# Patient Record
Sex: Female | Born: 1985 | Race: Black or African American | Hispanic: No | Marital: Single | Smoking: Current every day smoker
Health system: Southern US, Community
[De-identification: ages and names within clinical notes are randomized; demographics above are authoritative.]

## PROBLEM LIST (undated history)

## (undated) DIAGNOSIS — J45909 Unspecified asthma, uncomplicated: Secondary | ICD-10-CM

---

## 2014-08-09 ENCOUNTER — Encounter (HOSPITAL_BASED_OUTPATIENT_CLINIC_OR_DEPARTMENT_OTHER): Payer: Self-pay | Admitting: Emergency Medicine

## 2014-08-09 ENCOUNTER — Emergency Department (HOSPITAL_BASED_OUTPATIENT_CLINIC_OR_DEPARTMENT_OTHER): Payer: Medicaid Other

## 2014-08-09 ENCOUNTER — Emergency Department (HOSPITAL_BASED_OUTPATIENT_CLINIC_OR_DEPARTMENT_OTHER)
Admission: EM | Admit: 2014-08-09 | Discharge: 2014-08-09 | Disposition: A | Discharge status: Home / Self Care | Payer: Medicaid Other | Attending: Emergency Medicine | Admitting: Emergency Medicine

## 2014-08-09 DIAGNOSIS — F1010 Alcohol abuse, uncomplicated: Secondary | ICD-10-CM | POA: Diagnosis not present

## 2014-08-09 DIAGNOSIS — J45901 Unspecified asthma with (acute) exacerbation: Secondary | ICD-10-CM | POA: Diagnosis not present

## 2014-08-09 DIAGNOSIS — R0602 Shortness of breath: Secondary | ICD-10-CM | POA: Insufficient documentation

## 2014-08-09 DIAGNOSIS — F10920 Alcohol use, unspecified with intoxication, uncomplicated: Secondary | ICD-10-CM

## 2014-08-09 DIAGNOSIS — R0600 Dyspnea, unspecified: Secondary | ICD-10-CM

## 2014-08-09 HISTORY — DX: Unspecified asthma, uncomplicated: J45.909

## 2014-08-09 LAB — CBC WITH DIFFERENTIAL/PLATELET
BASOS ABS: 0 10*3/uL (ref 0.0–0.1)
Basophils Relative: 0 % (ref 0–1)
EOS PCT: 1 % (ref 0–5)
Eosinophils Absolute: 0.1 10*3/uL (ref 0.0–0.7)
HCT: 41.9 % (ref 36.0–46.0)
HEMOGLOBIN: 14.7 g/dL (ref 12.0–15.0)
LYMPHS ABS: 2.4 10*3/uL (ref 0.7–4.0)
LYMPHS PCT: 35 % (ref 12–46)
MCH: 30.8 pg (ref 26.0–34.0)
MCHC: 35.1 g/dL (ref 30.0–36.0)
MCV: 87.8 fL (ref 78.0–100.0)
MONO ABS: 0.5 10*3/uL (ref 0.1–1.0)
MONOS PCT: 7 % (ref 3–12)
Neutro Abs: 3.9 10*3/uL (ref 1.7–7.7)
Neutrophils Relative %: 57 % (ref 43–77)
Platelets: 187 10*3/uL (ref 150–400)
RBC: 4.77 MIL/uL (ref 3.87–5.11)
RDW: 12.6 % (ref 11.5–15.5)
WBC: 6.8 10*3/uL (ref 4.0–10.5)

## 2014-08-09 LAB — COMPREHENSIVE METABOLIC PANEL
ALT: <5 U/L (ref 0–35)
ANION GAP: 13 (ref 5–15)
AST: 17 U/L (ref 0–37)
Albumin: 4.3 g/dL (ref 3.5–5.2)
Alkaline Phosphatase: 81 U/L (ref 39–117)
BUN: 12 mg/dL (ref 6–23)
CHLORIDE: 106 meq/L (ref 96–112)
CO2: 25 meq/L (ref 19–32)
CREATININE: 0.9 mg/dL (ref 0.50–1.10)
Calcium: 9.8 mg/dL (ref 8.4–10.5)
GFR calc Af Amer: >90 mL/min (ref >90)
GFR, EST NON AFRICAN AMERICAN: 88 mL/min — AB (ref >90)
GLUCOSE: 107 mg/dL — AB (ref 70–99)
Potassium: 4 mEq/L (ref 3.7–5.3)
Sodium: 144 mEq/L (ref 137–147)
Total Protein: 8.6 g/dL — ABNORMAL HIGH (ref 6.0–8.3)

## 2014-08-09 LAB — RAPID URINE DRUG SCREEN, HOSP PERFORMED
AMPHETAMINES: NOT DETECTED
Barbiturates: NOT DETECTED
Benzodiazepines: NOT DETECTED
Cocaine: NOT DETECTED
OPIATES: NOT DETECTED
Tetrahydrocannabinol: NOT DETECTED

## 2014-08-09 LAB — ETHANOL: Alcohol, Ethyl (B): 138 mg/dL — ABNORMAL HIGH (ref 0–11)

## 2014-08-09 MED ORDER — SODIUM CHLORIDE 0.9 % IV BOLUS (SEPSIS)
1000.0000 mL | Freq: Once | INTRAVENOUS | Status: AC
  Administered 2014-08-09: 1000 mL via INTRAVENOUS

## 2014-08-09 NOTE — ED Notes (Signed)
MD at bedside. 
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
## 2014-08-09 NOTE — Discharge Instructions (Signed)
Shortness of Breath °Shortness of breath means you have trouble breathing. Shortness of breath needs medical care right away. °HOME CARE  °· Do not smoke. °· Avoid being around chemicals or things (paint fumes, dust) that may bother your breathing. °· Rest as needed. Slowly begin your normal activities. °· Only take medicines as told by your doctor. °· Keep all doctor visits as told. °GET HELP RIGHT AWAY IF:  °· Your shortness of breath gets worse. °· You feel lightheaded, pass out (faint), or have a cough that is not helped by medicine. °· You cough up blood. °· You have pain with breathing. °· You have pain in your chest, arms, shoulders, or belly (abdomen). °· You have a fever. °· You cannot walk up stairs or exercise the way you normally do. °· You do not get better in the time expected. °· You have a hard time doing normal activities even with rest. °· You have problems with your medicines. °· You have any new symptoms. °MAKE SURE YOU: °· Understand these instructions. °· Will watch your condition. °· Will get help right away if you are not doing well or get worse. °Document Released: 05/25/2008 Document Revised: 12/12/2013 Document Reviewed: 02/22/2012 °ExitCare® Patient Information ©2015 [employer]. This information is not intended to replace advice given to you by your health care provider. Make sure you discuss any questions you have with your health care provider. °Generalized Anxiety Disorder °Generalized anxiety disorder (GAD) is a mental disorder. It interferes with life functions, including relationships, work, and school. °GAD is different from normal anxiety, which everyone experiences at some point in their lives in response to specific life events and activities. Normal anxiety actually helps us prepare for and get through these life events and activities. Normal anxiety goes away after the event or activity is over.  °GAD causes anxiety that is not necessarily related to specific events or  activities. It also causes excess anxiety in proportion to specific events or activities. The anxiety associated with GAD is also difficult to control. GAD can vary from mild to severe. People with severe GAD can have intense waves of anxiety with physical symptoms (panic attacks).  °SYMPTOMS °The anxiety and worry associated with GAD are difficult to control. This anxiety and worry are related to many life events and activities and also occur more days than not for 6 months or longer. People with GAD also have three or more of the following symptoms (one or more in children): °· Restlessness.   °· Fatigue. °· Difficulty concentrating.   °· Irritability. °· Muscle tension. °· Difficulty sleeping or unsatisfying sleep. °DIAGNOSIS °GAD is diagnosed through an assessment by your health care provider. Your health care provider will ask you questions about your mood, physical symptoms, and events in your life. Your health care provider may ask you about your medical history and use of alcohol or drugs, including prescription medicines. Your health care provider may also do a physical exam and blood tests. Certain medical conditions and the use of certain substances can cause symptoms similar to those associated with GAD. Your health care provider may refer you to a mental health specialist for further evaluation. °TREATMENT °The following therapies are usually used to treat GAD:  °· Medication. Antidepressant medication usually is prescribed for long-term daily control. Antianxiety medicines may be added in severe cases, especially when panic attacks occur.   °· Talk therapy (psychotherapy). Certain types of talk therapy can be helpful in treating GAD by providing support, education, and guidance. A form of   talk therapy called cognitive behavioral therapy can teach you healthy ways to think about and react to daily life events and activities. °· Stress management techniques. These include yoga, meditation, and exercise  and can be very helpful when they are practiced regularly. °A mental health specialist can help determine which treatment is best for you. Some people see improvement with one therapy. However, other people require a combination of therapies. °Document Released: 04/03/2013 Document Revised: 04/23/2014 Document Reviewed: 04/03/2013 °ExitCare® Patient Information ©2015 [employer]. This information is not intended to replace advice given to you by your health care provider. Make sure you discuss any questions you have with your health care provider. ° °

## 2014-08-09 NOTE — ED Notes (Signed)
On arrival pt hyperventilating, c/o SOB; pt appears anxious, no distress

## 2015-06-29 ENCOUNTER — Encounter (HOSPITAL_BASED_OUTPATIENT_CLINIC_OR_DEPARTMENT_OTHER): Payer: Self-pay | Admitting: Emergency Medicine

## 2015-06-29 ENCOUNTER — Emergency Department (HOSPITAL_BASED_OUTPATIENT_CLINIC_OR_DEPARTMENT_OTHER)
Admission: EM | Admit: 2015-06-29 | Discharge: 2015-06-29 | Disposition: A | Discharge status: Home / Self Care | Payer: Medicaid Other | Attending: Emergency Medicine | Admitting: Emergency Medicine

## 2015-06-29 DIAGNOSIS — Z3202 Encounter for pregnancy test, result negative: Secondary | ICD-10-CM | POA: Insufficient documentation

## 2015-06-29 DIAGNOSIS — N926 Irregular menstruation, unspecified: Secondary | ICD-10-CM

## 2015-06-29 DIAGNOSIS — R109 Unspecified abdominal pain: Secondary | ICD-10-CM | POA: Diagnosis present

## 2015-06-29 DIAGNOSIS — J45909 Unspecified asthma, uncomplicated: Secondary | ICD-10-CM | POA: Insufficient documentation

## 2015-06-29 LAB — CBC WITH DIFFERENTIAL/PLATELET
Basophils Absolute: 0 10*3/uL (ref 0.0–0.1)
Basophils Relative: 0 % (ref 0–1)
Eosinophils Absolute: 0.1 10*3/uL (ref 0.0–0.7)
Eosinophils Relative: 1 % (ref 0–5)
HCT: 41.3 % (ref 36.0–46.0)
Hemoglobin: 14.3 g/dL (ref 12.0–15.0)
Lymphocytes Relative: 19 % (ref 12–46)
Lymphs Abs: 1.3 10*3/uL (ref 0.7–4.0)
MCH: 31.4 pg (ref 26.0–34.0)
MCHC: 34.6 g/dL (ref 30.0–36.0)
MCV: 90.8 fL (ref 78.0–100.0)
Monocytes Absolute: 0.4 10*3/uL (ref 0.1–1.0)
Monocytes Relative: 6 % (ref 3–12)
NEUTROS PCT: 74 % (ref 43–77)
Neutro Abs: 5.2 10*3/uL (ref 1.7–7.7)
PLATELETS: 177 10*3/uL (ref 150–400)
RBC: 4.55 MIL/uL (ref 3.87–5.11)
RDW: 13 % (ref 11.5–15.5)
WBC: 7 10*3/uL (ref 4.0–10.5)

## 2015-06-29 LAB — COMPREHENSIVE METABOLIC PANEL
ALBUMIN: 4.5 g/dL (ref 3.5–5.0)
ALK PHOS: 62 U/L (ref 38–126)
ALT: 14 U/L (ref 14–54)
ANION GAP: 5 (ref 5–15)
AST: 24 U/L (ref 15–41)
BILIRUBIN TOTAL: 0.5 mg/dL (ref 0.3–1.2)
BUN: 14 mg/dL (ref 6–20)
CHLORIDE: 107 mmol/L (ref 101–111)
CO2: 26 mmol/L (ref 22–32)
Calcium: 9 mg/dL (ref 8.9–10.3)
Creatinine, Ser: 0.93 mg/dL (ref 0.44–1.00)
GFR calc non Af Amer: >60 mL/min (ref >60)
Glucose, Bld: 87 mg/dL (ref 65–99)
POTASSIUM: 4 mmol/L (ref 3.5–5.1)
SODIUM: 138 mmol/L (ref 135–145)
TOTAL PROTEIN: 7.6 g/dL (ref 6.5–8.1)

## 2015-06-29 LAB — URINALYSIS, ROUTINE W REFLEX MICROSCOPIC
Bilirubin Urine: NEGATIVE
GLUCOSE, UA: NEGATIVE mg/dL
HGB URINE DIPSTICK: NEGATIVE
KETONES UR: NEGATIVE mg/dL
Nitrite: NEGATIVE
PH: 8 (ref 5.0–8.0)
PROTEIN: NEGATIVE mg/dL
Specific Gravity, Urine: 1.022 (ref 1.005–1.030)
Urobilinogen, UA: 1 mg/dL (ref 0.0–1.0)

## 2015-06-29 LAB — URINE MICROSCOPIC-ADD ON

## 2015-06-29 LAB — PREGNANCY, URINE: Preg Test, Ur: NEGATIVE

## 2015-06-29 LAB — LIPASE, BLOOD: Lipase: 16 U/L — ABNORMAL LOW (ref 22–51)

## 2015-06-29 MED ORDER — SODIUM CHLORIDE 0.9 % IV BOLUS (SEPSIS)
1000.0000 mL | Freq: Once | INTRAVENOUS | Status: AC
  Administered 2015-06-29: 1000 mL via INTRAVENOUS

## 2015-06-29 MED ORDER — ONDANSETRON HCL 4 MG/2ML IJ SOLN
4.0000 mg | Freq: Once | INTRAMUSCULAR | Status: AC
  Administered 2015-06-29: 4 mg via INTRAVENOUS
  Filled 2015-06-29: qty 2

## 2015-06-29 NOTE — ED Notes (Signed)
Pt in c/o pelvic pain and dysfunctional menstrual cycles x 1 month. Today began and "twisting" feeling in her upper abdomen associated with nausea and dizziness.

## 2015-06-29 NOTE — ED Provider Notes (Signed)
CSN: [id number]     Arrival date & time 06/29/15  1137 History   First MD Initiated Contact with Patient 06/29/15 1209     Chief Complaint  Patient presents with  . Abdominal Pain     (Consider location/radiation/quality/duration/timing/severity/associated sxs/prior Treatment) HPI   Blood pressure 120/85, pulse 64, temperature 97.5 F (36.4 C), temperature source Oral, resp. rate 18, height  (1.575 m), weight 150 lb (68.04 kg), SpO2 100 %.  Sharon Murphy is a 29 y.o. female complaining of irregular menses with painful menstrual cramps worsening over the course of 2 months last period was July 2 and lasted for 3 days. Patient also reports a single episode of nonbloody, bilious, non-coffee ground emesis with diarrhea onset this a.m.  She has drank a bottle of water since the episode of emesis.  Patient denies fever, chills, sick contacts, rash, camping, hematochezia, melena, recent antibotics use. She rates her pain at 7 out of 10, described as cramping, no exacerbating or alleviating factors identified, and she says it is diffuse.She states that on her last period she was passing more blood clots than normal. [facility] 4 days ago, he performed a pelvic exam and her a prescription for birth control medications which she did not fill. Patient states she's never taken birth control before so she doesn't want to start now.   Past Medical History  Diagnosis Date  . Asthma    Past Surgical History  Procedure Laterality Date  . Cesarean section     History reviewed. No pertinent family history. History  Substance Use Topics  . Smoking status: Never Smoker   . Smokeless tobacco: Not on file  . Alcohol Use: Yes   OB History    No data available     Review of Systems  10 systems reviewed and found to be negative, except as noted in the HPI.   Allergies  Review of patient's allergies indicates no known allergies.  Home Medications    Prior to Admission medications   Not on File   BP 102/87 mmHg  Pulse 62  Temp(Src) 97.5 F (36.4 C) (Oral)  Resp 16  Ht  (1.575 m)  Wt 150 lb (68.04 kg)  BMI 27.43 kg/m2  SpO2 100% Physical Exam  Constitutional: She is oriented to person, place, and time. She appears well-developed and well-nourished. No distress.  Laughing, watching a video on her phone with her guest  HENT:  Head: Normocephalic and atraumatic.  Mouth/Throat: Oropharynx is clear and moist.  No Conjunctival pallor  Eyes: Conjunctivae and EOM are normal. Pupils are equal, round, and reactive to light.  Neck: Normal range of motion.  Cardiovascular: Normal rate, regular rhythm and intact distal pulses.   Pulmonary/Chest: Effort normal and breath sounds normal. No stridor.  Abdominal: Soft. Bowel sounds are normal. She exhibits no distension and no mass. There is no tenderness. There is no rebound and no guarding.  Mild, diffuse tenderness to palpation with no guarding or rebound.  Murphy sign negative, no tenderness to palpation over McBurney's point, Rovsings, Psoas and obturator all negative.   Musculoskeletal: Normal range of motion.  Neurological: She is alert and oriented to person, place, and time.  Skin: She is not diaphoretic.  Psychiatric: She has a normal mood and affect.  Nursing note and vitals reviewed.   ED Course  Procedures (including critical care time) Labs Review Labs Reviewed  URINALYSIS, ROUTINE W REFLEX MICROSCOPIC (NOT AT [facility]) -  Abnormal; Notable for the following:    APPearance CLOUDY (*)    Leukocytes, UA SMALL (*)    All other components within normal limits  LIPASE, BLOOD - Abnormal; Notable for the following:    Lipase 16 (*)    All other components within normal limits  URINE MICROSCOPIC-ADD ON - Abnormal; Notable for the following:    Squamous Epithelial / LPF FEW (*)    Bacteria, UA FEW (*)    All other components within normal limits  PREGNANCY, URINE  CBC  WITH DIFFERENTIAL/PLATELET  COMPREHENSIVE METABOLIC PANEL    Imaging Review No results found.   EKG Interpretation None      MDM   Final diagnoses:  Abnormal menses    Filed Vitals:   06/29/15 1143 06/29/15 1316  BP: 120/85 102/87  Pulse: 64 62  Temp: 97.5 F (36.4 C)   TempSrc: Oral   Resp: 18 16  Height: 5\' 2"  (1.575 m)   Weight: 150 lb (68.04 kg)   SpO2: 100% 100%    Medications  sodium chloride 0.9 % bolus 1,000 mL (0 mLs Intravenous Stopped 06/29/15 1314)  ondansetron (ZOFRAN) injection 4 mg (4 mg Intravenous Given 06/29/15 1240)    Sharon Murphy is a pleasant 29 y.o. female presenting with irregular and painful menses worsening over the course of 2 months. Patient saw her OB/GYN and had a pelvic exam within the last week. OB/GYN recommended oral birth control but patient prefers not to take this. Abdominal exam is nonsurgical, no abnormalities in vital signs. Will check orthostatics, basic blood work and urinalysis. Declines pain meds,   Pt is tolerating PO, repeat abd exam benign, work and urinalysis without acute abnormality. Encouraged patient to follow with her OB/GYN and start taking the oral birth control, we discussed how this will normalize her hormones and provide more regular and less painful menses.  Evaluation does not show pathology that would require ongoing emergent intervention or inpatient treatment. Pt is hemodynamically stable and mentating appropriately. Discussed findings and plan with patient/guardian, who agrees with care plan. All questions answered. Return precautions discussed and outpatient follow up given.     Wynetta Emeryicole Jasmin Winberry, PA-C 06/29/15 1333  Vanetta MuldersScott Zackowski, MD 06/30/15 [phone]

## 2015-06-29 NOTE — Discharge Instructions (Signed)
Please follow with your primary care doctor in the next 2 days for a check-up. They must obtain records for further management.  ° °Do not hesitate to return to the Emergency Department for any new, worsening or concerning symptoms.  ° ° °Abnormal Uterine Bleeding °Abnormal uterine bleeding means bleeding from the vagina that is not your normal menstrual period. This can be: °· Bleeding or spotting between periods. °· Bleeding after sex (sexual intercourse). °· Bleeding that is heavier or more than normal. °· Periods that last longer than usual. °· Bleeding after menopause. °There are many problems that may cause this. Treatment will depend on the cause of the bleeding. Any kind of bleeding that is not normal should be reviewed by your doctor.  °HOME CARE °Watch your condition for any changes. These actions may lessen any discomfort you are having: °· Do not use tampons or douches as told by your doctor. °· Change your pads often. °You should get regular pelvic exams and Pap tests. Keep all appointments for tests as told by your doctor. °GET HELP IF: °· You are bleeding for more than 1 week. °· You feel dizzy at times. °GET HELP RIGHT AWAY IF:  °· You pass out. °· You have to change pads every 15 to 30 minutes. °· You have belly pain. °· You have a fever. °· You become sweaty or weak. °· You are passing large blood clots from the vagina. °· You feel sick to your stomach (nauseous) and throw up (vomit). °MAKE SURE YOU: °· Understand these instructions. °· Will watch your condition. °· Will get help right away if you are not doing well or get worse. °Document Released: 10/04/2009 Document Revised: 12/12/2013 Document Reviewed: 07/06/2013 °Exit[employer]® Patient Information ©2015 [employer]. This information is not intended to replace advice given to you by your health care provider. Make sure you discuss any questions you have with your health care provider. ° °

## 2015-06-29 NOTE — ED Notes (Signed)
States having irregular periods, approx 2/ month, also having abd cramping, at times is more heavier with often having blood clots being noted during the period.

## 2015-06-29 NOTE — ED Notes (Signed)
States also having poor appetite at times as well.

## 2017-09-02 ENCOUNTER — Emergency Department: Payer: No Typology Code available for payment source

## 2017-09-02 ENCOUNTER — Encounter: Payer: Self-pay | Admitting: Emergency Medicine

## 2017-09-02 ENCOUNTER — Emergency Department
Admission: EM | Admit: 2017-09-02 | Discharge: 2017-09-02 | Disposition: A | Discharge status: Home / Self Care | Payer: No Typology Code available for payment source | Attending: Student in an Organized Health Care Education/Training Program | Admitting: Student in an Organized Health Care Education/Training Program

## 2017-09-02 DIAGNOSIS — M25512 Pain in left shoulder: Secondary | ICD-10-CM | POA: Insufficient documentation

## 2017-09-02 DIAGNOSIS — Y998 Other external cause status: Secondary | ICD-10-CM | POA: Insufficient documentation

## 2017-09-02 DIAGNOSIS — Y9389 Activity, other specified: Secondary | ICD-10-CM | POA: Insufficient documentation

## 2017-09-02 DIAGNOSIS — M79642 Pain in left hand: Secondary | ICD-10-CM | POA: Diagnosis not present

## 2017-09-02 DIAGNOSIS — Y92411 Interstate highway as the place of occurrence of the external cause: Secondary | ICD-10-CM | POA: Insufficient documentation

## 2017-09-02 DIAGNOSIS — S61412A Laceration without foreign body of left hand, initial encounter: Secondary | ICD-10-CM | POA: Diagnosis not present

## 2017-09-02 MED ORDER — HYDROCODONE-ACETAMINOPHEN 5-325 MG PO TABS: 1.0000 | ORAL_TABLET | Freq: Four times a day (QID) | ORAL | 0 refills | Status: AC | PRN
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
## 2017-09-02 NOTE — ED Notes (Signed)
Pt wound clean and dry, non stick adhesive applied and wrapped with gauze at this time

## 2018-06-21 ENCOUNTER — Other Ambulatory Visit: Payer: Self-pay

## 2018-06-21 ENCOUNTER — Emergency Department (HOSPITAL_BASED_OUTPATIENT_CLINIC_OR_DEPARTMENT_OTHER): Payer: Self-pay

## 2018-06-21 ENCOUNTER — Emergency Department (HOSPITAL_BASED_OUTPATIENT_CLINIC_OR_DEPARTMENT_OTHER)
Admission: EM | Admit: 2018-06-21 | Discharge: 2018-06-22 | Disposition: A | Discharge status: Home / Self Care | Payer: Self-pay | Attending: Emergency Medicine | Admitting: Emergency Medicine

## 2018-06-21 ENCOUNTER — Encounter (HOSPITAL_BASED_OUTPATIENT_CLINIC_OR_DEPARTMENT_OTHER): Payer: Self-pay

## 2018-06-21 DIAGNOSIS — J45909 Unspecified asthma, uncomplicated: Secondary | ICD-10-CM | POA: Insufficient documentation

## 2018-06-21 DIAGNOSIS — F1210 Cannabis abuse, uncomplicated: Secondary | ICD-10-CM | POA: Insufficient documentation

## 2018-06-21 DIAGNOSIS — R112 Nausea with vomiting, unspecified: Secondary | ICD-10-CM | POA: Insufficient documentation

## 2018-06-21 DIAGNOSIS — F17200 Nicotine dependence, unspecified, uncomplicated: Secondary | ICD-10-CM | POA: Insufficient documentation

## 2018-06-21 DIAGNOSIS — N390 Urinary tract infection, site not specified: Secondary | ICD-10-CM | POA: Insufficient documentation

## 2018-06-21 DIAGNOSIS — R350 Frequency of micturition: Secondary | ICD-10-CM | POA: Insufficient documentation

## 2018-06-21 DIAGNOSIS — R197 Diarrhea, unspecified: Secondary | ICD-10-CM | POA: Insufficient documentation

## 2018-06-21 LAB — CBC WITH DIFFERENTIAL/PLATELET
Basophils Absolute: 0 10*3/uL (ref 0.0–0.1)
Basophils Relative: 0 %
EOS PCT: 1 %
Eosinophils Absolute: 0.1 10*3/uL (ref 0.0–0.7)
HEMATOCRIT: 43.4 % (ref 36.0–46.0)
Hemoglobin: 15.6 g/dL — ABNORMAL HIGH (ref 12.0–15.0)
LYMPHS ABS: 1.1 10*3/uL (ref 0.7–4.0)
Lymphocytes Relative: 9 %
MCH: 31.5 pg (ref 26.0–34.0)
MCHC: 35.9 g/dL (ref 30.0–36.0)
MCV: 87.7 fL (ref 78.0–100.0)
Monocytes Absolute: 0.8 10*3/uL (ref 0.1–1.0)
Monocytes Relative: 7 %
Neutro Abs: 9.7 10*3/uL — ABNORMAL HIGH (ref 1.7–7.7)
Neutrophils Relative %: 83 %
PLATELETS: 190 10*3/uL (ref 150–400)
RBC: 4.95 MIL/uL (ref 3.87–5.11)
RDW: 12.7 % (ref 11.5–15.5)
WBC: 11.6 10*3/uL — AB (ref 4.0–10.5)

## 2018-06-21 LAB — URINALYSIS, ROUTINE W REFLEX MICROSCOPIC
Glucose, UA: NEGATIVE mg/dL
Ketones, ur: 40 mg/dL — AB
NITRITE: NEGATIVE
Protein, ur: NEGATIVE mg/dL
SPECIFIC GRAVITY, URINE: 1.025 (ref 1.005–1.030)
pH: 6 (ref 5.0–8.0)

## 2018-06-21 LAB — URINALYSIS, MICROSCOPIC (REFLEX)

## 2018-06-21 LAB — COMPREHENSIVE METABOLIC PANEL
ALT: 17 U/L (ref 0–44)
ANION GAP: 10 (ref 5–15)
AST: 33 U/L (ref 15–41)
Albumin: 4.3 g/dL (ref 3.5–5.0)
Alkaline Phosphatase: 90 U/L (ref 38–126)
BUN: 15 mg/dL (ref 6–20)
CO2: 24 mmol/L (ref 22–32)
Calcium: 9.1 mg/dL (ref 8.9–10.3)
Chloride: 103 mmol/L (ref 98–111)
Creatinine, Ser: 0.91 mg/dL (ref 0.44–1.00)
GFR calc Af Amer: >60 mL/min (ref >60)
GFR calc non Af Amer: >60 mL/min (ref >60)
Glucose, Bld: 88 mg/dL (ref 70–99)
POTASSIUM: 3.6 mmol/L (ref 3.5–5.1)
Sodium: 137 mmol/L (ref 135–145)
TOTAL PROTEIN: 8 g/dL (ref 6.5–8.1)
Total Bilirubin: 0.5 mg/dL (ref 0.3–1.2)

## 2018-06-21 LAB — PREGNANCY, URINE: Preg Test, Ur: NEGATIVE

## 2018-06-21 LAB — LIPASE, BLOOD: Lipase: 28 U/L (ref 11–51)

## 2018-06-21 MED ORDER — NITROFURANTOIN MONOHYD MACRO 100 MG PO CAPS
100.0000 mg | ORAL_CAPSULE | Freq: Once | ORAL | Status: AC
  Administered 2018-06-21: 100 mg via ORAL
  Filled 2018-06-21: qty 1

## 2018-06-21 MED ORDER — NITROFURANTOIN MONOHYD MACRO 100 MG PO CAPS: 100.0000 mg | ORAL_CAPSULE | Freq: Two times a day (BID) | ORAL | 0 refills | Status: AC

## 2018-06-21 MED ORDER — KETOROLAC TROMETHAMINE 30 MG/ML IJ SOLN
15.0000 mg | Freq: Once | INTRAMUSCULAR | Status: AC
  Administered 2018-06-21: 15 mg via INTRAVENOUS
  Filled 2018-06-21: qty 1

## 2018-06-21 MED ORDER — ONDANSETRON HCL 4 MG/2ML IJ SOLN
4.0000 mg | Freq: Once | INTRAMUSCULAR | Status: AC
  Administered 2018-06-21: 4 mg via INTRAVENOUS
  Filled 2018-06-21: qty 2

## 2018-06-21 NOTE — ED Notes (Signed)
ED Provider at bedside. 

## 2018-06-21 NOTE — ED Triage Notes (Signed)
C/o abd pain, diarrhea, sore throat x 3 days-NAD-steady gait to scale-to triage in w/c

## 2018-06-22 ENCOUNTER — Encounter (HOSPITAL_BASED_OUTPATIENT_CLINIC_OR_DEPARTMENT_OTHER): Payer: Self-pay | Admitting: Emergency Medicine

## 2018-06-22 NOTE — ED Provider Notes (Signed)
[facility] EMERGENCY DEPARTMENT Provider Note   CSN: [id number] Arrival date & time: 06/21/18  1951     History   Chief Complaint Chief Complaint  Patient presents with  . Abdominal Pain    HPI Sharon Murphy is a 32 y.o. female.  The history is provided by the patient.  Abdominal Pain   This is a new problem. The current episode started 2 days ago. The problem occurs constantly. The problem has not changed since onset.The pain is associated with an unknown factor. The pain is located in the suprapubic region. The quality of the pain is cramping. The pain is at a severity of 5/10. The pain is moderate. Associated symptoms include diarrhea, nausea, vomiting and frequency. Pertinent negatives include fever and headaches. Nothing aggravates the symptoms. Nothing relieves the symptoms. Past workup does not include barium enema. Her past medical history does not include irritable bowel syndrome.    Past Medical History:  Diagnosis Date  . Asthma     There are no active problems to display for this patient.   Past Surgical History:  Procedure Laterality Date  . CESAREAN SECTION       OB History   None      Home Medications    Prior to Admission medications   Medication Sig Start Date End Date Taking? Authorizing Provider  HYDROcodone-acetaminophen (NORCO/VICODIN) 5-325 MG tablet Take 1 tablet by mouth every 6 (six) hours as needed for moderate pain. 09/02/17   Tommi Rumps, PA-C  nitrofurantoin, macrocrystal-monohydrate, (MACROBID) 100 MG capsule Take 1 capsule (100 mg total) by mouth 2 (two) times daily. X 7 days 06/21/18   Nicanor Alcon, Aaidyn San, MD    Family History No family history on file.  Social History Social History   Tobacco Use  . Smoking status: Current Every Day Smoker  . Smokeless tobacco: Never Used  Substance Use Topics  . Alcohol use: Yes    Comment: weekly  . Drug use: Yes    Types: Marijuana     Allergies   Patient has no known  allergies.   Review of Systems Review of Systems  Constitutional: Negative for fever and unexpected weight change.  Eyes: Negative for photophobia.  Respiratory: Negative for shortness of breath.   Cardiovascular: Negative for chest pain.  Gastrointestinal: Positive for abdominal pain, diarrhea, nausea and vomiting.  Genitourinary: Positive for frequency. Negative for flank pain.  Neurological: Negative for headaches.  All other systems reviewed and are negative.    Physical Exam Updated Vital Signs BP 126/84 (BP Location: Left Arm)   Pulse 92   Temp 98.3 F (36.8 C) (Oral)   Resp 18   Ht 5\' 2"  (1.575 m)   Wt 75.4 kg (166 lb 3 oz)   LMP 06/14/2018   SpO2 98%   BMI 30.40 kg/m   Physical Exam  Constitutional: She is oriented to person, place, and time. She appears well-developed and well-nourished. No distress.  HENT:  Head: Normocephalic and atraumatic.  Mouth/Throat: No oropharyngeal exudate.  Tonsillar stones   Eyes: Pupils are equal, round, and reactive to light. Conjunctivae are normal.  Neck: Normal range of motion. Neck supple.  Cardiovascular: Normal rate, regular rhythm, normal heart sounds and intact distal pulses.  Pulmonary/Chest: Effort normal and breath sounds normal. No stridor. She has no wheezes. She has no rales.  Abdominal: Soft. Bowel sounds are normal. She exhibits no distension and no mass. There is no tenderness. There is no rebound and no guarding. No hernia.  Musculoskeletal: Normal range of motion.  Neurological: She is alert and oriented to person, place, and time. She displays normal reflexes.  Skin: Skin is warm and dry. Capillary refill takes less than 2 seconds.  Psychiatric: She has a normal mood and affect.     ED Treatments / Results  Labs (all labs ordered are listed, but only abnormal results are displayed) Results for orders placed or performed during the hospital encounter of 06/21/18  CBC with Differential  Result Value Ref  Range   WBC 11.6 (H) 4.0 - 10.5 K/uL   RBC 4.95 3.87 - 5.11 MIL/uL   Hemoglobin 15.6 (H) 12.0 - 15.0 g/dL   HCT 16.1 09.6 - 04.5 %   MCV 87.7 78.0 - 100.0 fL   MCH 31.5 26.0 - 34.0 pg   MCHC 35.9 30.0 - 36.0 g/dL   RDW 40.9 81.1 - 91.4 %   Platelets 190 150 - 400 K/uL   Neutrophils Relative % 83 %   Neutro Abs 9.7 (H) 1.7 - 7.7 K/uL   Lymphocytes Relative 9 %   Lymphs Abs 1.1 0.7 - 4.0 K/uL   Monocytes Relative 7 %   Monocytes Absolute 0.8 0.1 - 1.0 K/uL   Eosinophils Relative 1 %   Eosinophils Absolute 0.1 0.0 - 0.7 K/uL   Basophils Relative 0 %   Basophils Absolute 0.0 0.0 - 0.1 K/uL  Comprehensive metabolic panel  Result Value Ref Range   Sodium 137 135 - 145 mmol/L   Potassium 3.6 3.5 - 5.1 mmol/L   Chloride 103 98 - 111 mmol/L   CO2 24 22 - 32 mmol/L   Glucose, Bld 88 70 - 99 mg/dL   BUN 15 6 - 20 mg/dL   Creatinine, Ser 7.82 0.44 - 1.00 mg/dL   Calcium 9.1 8.9 - 95.6 mg/dL   Total Protein 8.0 6.5 - 8.1 g/dL   Albumin 4.3 3.5 - 5.0 g/dL   AST 33 15 - 41 U/L   ALT 17 0 - 44 U/L   Alkaline Phosphatase 90 38 - 126 U/L   Total Bilirubin 0.5 0.3 - 1.2 mg/dL   GFR calc non Af Amer >60 >60 mL/min   GFR calc Af Amer >60 >60 mL/min   Anion gap 10 5 - 15  Lipase, blood  Result Value Ref Range   Lipase 28 11 - 51 U/L  Pregnancy, urine  Result Value Ref Range   Preg Test, Ur NEGATIVE NEGATIVE  Urinalysis, Routine w reflex microscopic  Result Value Ref Range   Color, Urine YELLOW YELLOW   APPearance HAZY (A) CLEAR   Specific Gravity, Urine 1.025 1.005 - 1.030   pH 6.0 5.0 - 8.0   Glucose, UA NEGATIVE NEGATIVE mg/dL   Hgb urine dipstick SMALL (A) NEGATIVE   Bilirubin Urine SMALL (A) NEGATIVE   Ketones, ur 40 (A) NEGATIVE mg/dL   Protein, ur NEGATIVE NEGATIVE mg/dL   Nitrite NEGATIVE NEGATIVE   Leukocytes, UA SMALL (A) NEGATIVE  Urinalysis, Microscopic (reflex)  Result Value Ref Range   RBC / HPF 11-20 0 - 5 RBC/hpf   WBC, UA 21-50 0 - 5 WBC/hpf   Bacteria, UA FEW  (A) NONE SEEN   Squamous Epithelial / LPF 6-10 0 - 5   Mucus PRESENT    Ca Oxalate Crys, UA PRESENT    Ct Renal Stone Study  Result Date: 06/21/2018 CLINICAL DATA:  Intermittent abdominal pain EXAM: CT ABDOMEN AND PELVIS WITHOUT CONTRAST TECHNIQUE: Multidetector CT imaging of the abdomen  and pelvis was performed following the standard protocol without IV contrast. COMPARISON:  None. FINDINGS: Lower chest: No acute abnormality. Hepatobiliary: No focal liver abnormality is seen. No gallstones, gallbladder wall thickening, or biliary dilatation. Pancreas: Unremarkable. No pancreatic ductal dilatation or surrounding inflammatory changes. Spleen: Normal in size without focal abnormality. Adrenals/Urinary Tract: Adrenal glands are unremarkable. Kidneys are normal, without renal calculi, focal lesion, or hydronephrosis. Bladder is unremarkable. Stomach/Bowel: Stomach is within normal limits. Appendix appears normal. No evidence of bowel wall thickening, distention, or inflammatory changes. Vascular/Lymphatic: No significant vascular findings are present. No enlarged abdominal or pelvic lymph nodes. Reproductive: Uterus and bilateral adnexa are unremarkable. Other: No abdominal wall hernia or abnormality. No abdominopelvic ascites. Musculoskeletal: No acute or significant osseous findings. IMPRESSION: Negative. No CT evidence for acute intra-abdominal or pelvic abnormality. Electronically Signed   By: Jasmine Pang M.D.   On: 06/21/2018 23:36    EKG None  Radiology Ct Renal Stone Study  Result Date: 06/21/2018 CLINICAL DATA:  Intermittent abdominal pain EXAM: CT ABDOMEN AND PELVIS WITHOUT CONTRAST TECHNIQUE: Multidetector CT imaging of the abdomen and pelvis was performed following the standard protocol without IV contrast. COMPARISON:  None. FINDINGS: Lower chest: No acute abnormality. Hepatobiliary: No focal liver abnormality is seen. No gallstones, gallbladder wall thickening, or biliary dilatation.  Pancreas: Unremarkable. No pancreatic ductal dilatation or surrounding inflammatory changes. Spleen: Normal in size without focal abnormality. Adrenals/Urinary Tract: Adrenal glands are unremarkable. Kidneys are normal, without renal calculi, focal lesion, or hydronephrosis. Bladder is unremarkable. Stomach/Bowel: Stomach is within normal limits. Appendix appears normal. No evidence of bowel wall thickening, distention, or inflammatory changes. Vascular/Lymphatic: No significant vascular findings are present. No enlarged abdominal or pelvic lymph nodes. Reproductive: Uterus and bilateral adnexa are unremarkable. Other: No abdominal wall hernia or abnormality. No abdominopelvic ascites. Musculoskeletal: No acute or significant osseous findings. IMPRESSION: Negative. No CT evidence for acute intra-abdominal or pelvic abnormality. Electronically Signed   By: Jasmine Pang M.D.   On: 06/21/2018 23:36    Procedures Procedures (including critical care time)  Medications Ordered in ED Medications  ketorolac (TORADOL) 30 MG/ML injection 15 mg (15 mg Intravenous Given 06/21/18 2334)  ondansetron (ZOFRAN) injection 4 mg (4 mg Intravenous Given 06/21/18 2334)  nitrofurantoin (macrocrystal-monohydrate) (MACROBID) capsule 100 mg (100 mg Oral Given 06/21/18 2357)       Final Clinical Impressions(s) / ED Diagnoses   Final diagnoses:  Lower urinary tract infectious disease  Nausea vomiting and diarrhea  Viral n/v/d.  Will treat for UTI.  Follow up with your PMD for recheck in 2 days.    Return for pain, numbness, changes in vision or speech, fevers >100.4 unrelieved by medication, shortness of breath, intractable vomiting, or diarrhea, abdominal pain, Inability to tolerate liquids or food, cough, altered mental status or any concerns. No signs of systemic illness or infection. The patient is nontoxic-appearing on exam and vital signs are within normal limits. Will refer to urology for microscopy hematuria as  patient is asymptomatic.  I have reviewed the triage vital signs and the nursing notes. Pertinent labs &imaging results that were available during my care of the patient were reviewed by me and considered in my medical decision making (see chart for details).  After history, exam, and medical workup I feel the patient has been appropriately medically screened and is safe for discharge home. Pertinent diagnoses were discussed with the patient. Patient was given return precautions.  ED Discharge Orders        Ordered  nitrofurantoin, macrocrystal-monohydrate, (MACROBID) 100 MG capsule  2 times daily     06/21/18 2353       Branston Halsted, MD 06/22/18 0041

## 2019-05-22 IMAGING — CT CT RENAL STONE PROTOCOL
2 of 4 series · 16 of 46 positions shown, 18 images · non-contrast
Comparison: None.

CLINICAL DATA: Intermittent abdominal pain

EXAM:
CT ABDOMEN AND PELVIS WITHOUT CONTRAST
TECHNIQUE: Multidetector CT imaging of the abdomen and pelvis was performed
following the standard protocol without IV contrast.

[Series 2: axial st · axial · 0.76mm/px · z∈[-735,-275]mm · 13 of 100 slices shown, 15 images]
[im 4/100  soft-tissue]
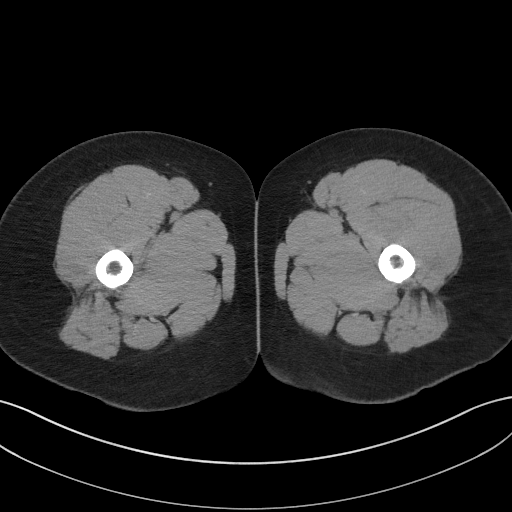
[im 4/100  bone]
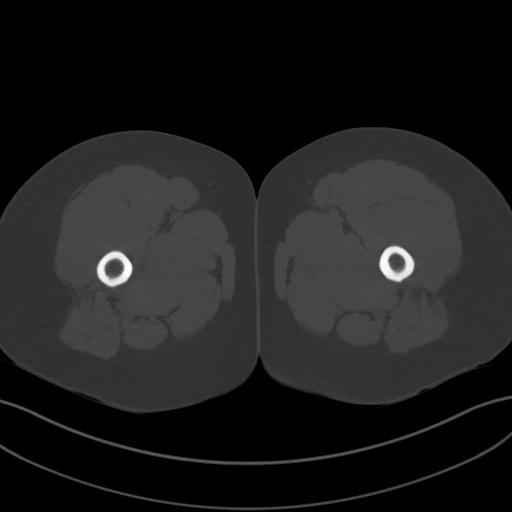
[im 12/100  soft-tissue]
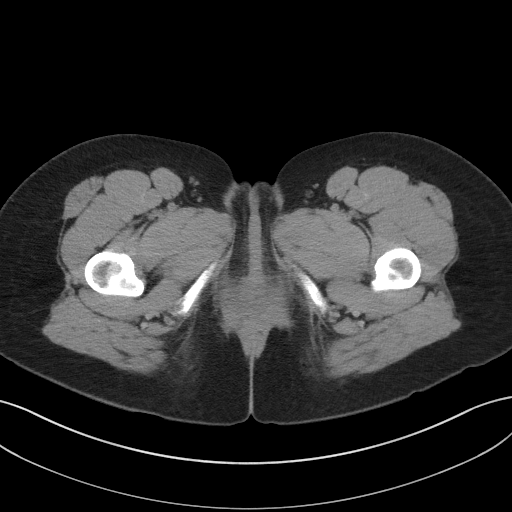
[im 20/100  soft-tissue]
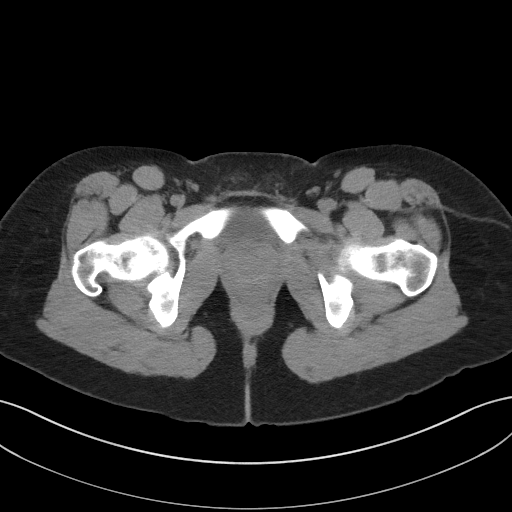
[im 27/100  soft-tissue]
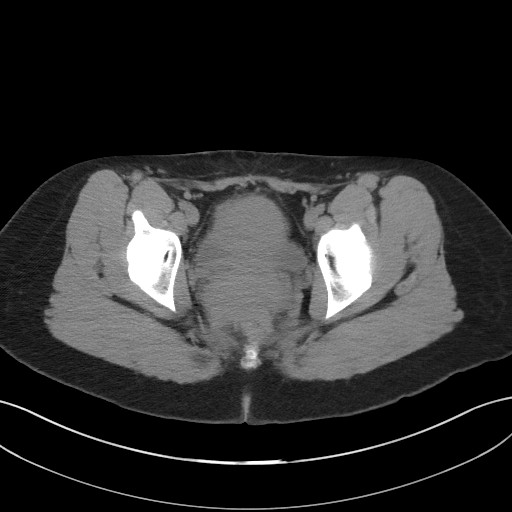
[im 35/100  soft-tissue]
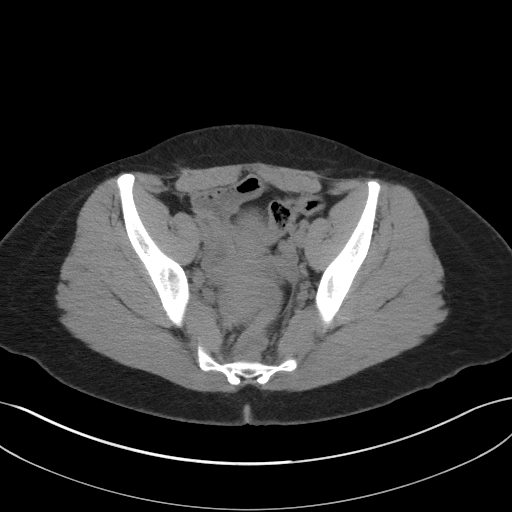
[im 42/100  soft-tissue]
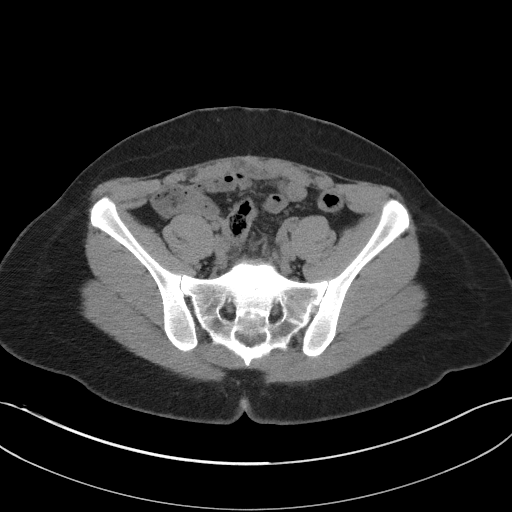
[im 50/100  soft-tissue]
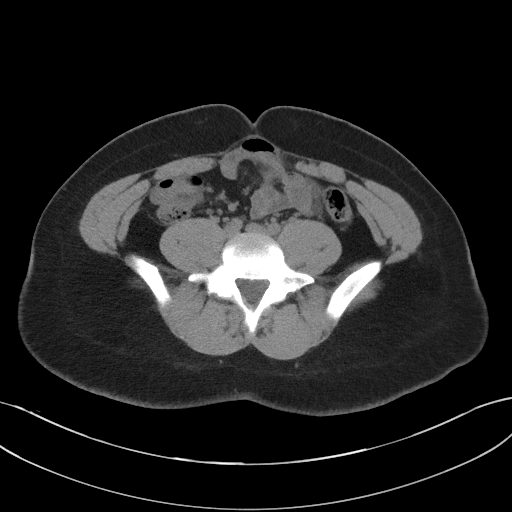
[im 58/100  soft-tissue]
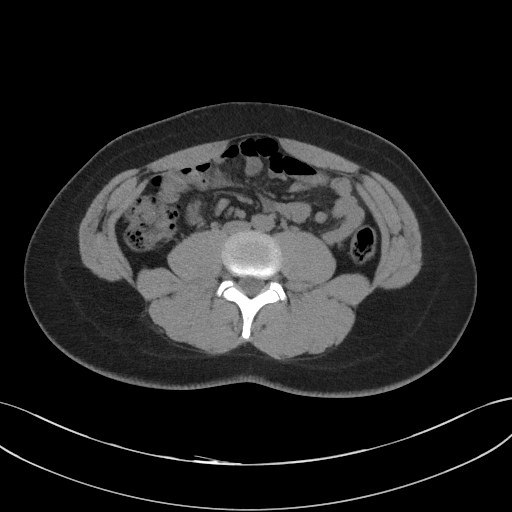
[im 65/100  soft-tissue]
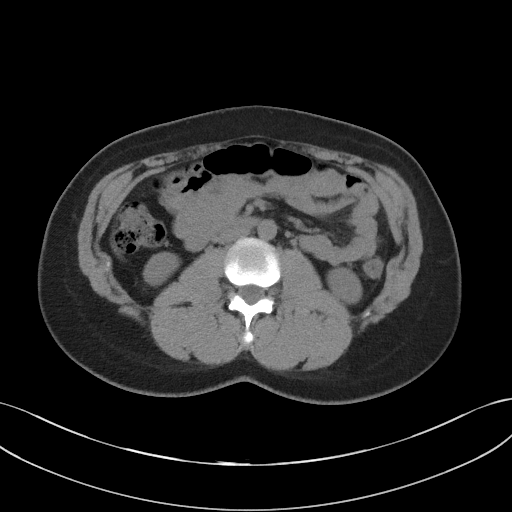
[im 65/100  bone]
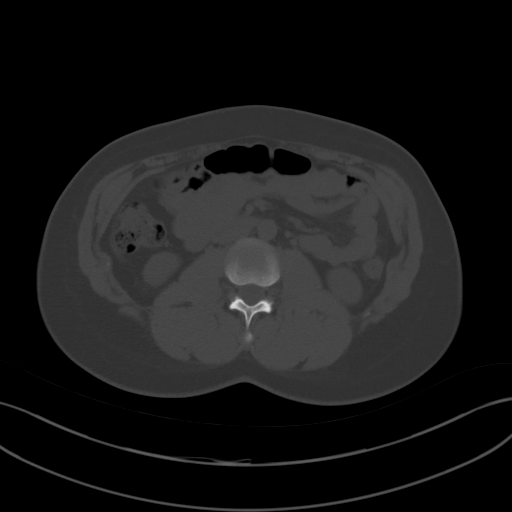
[im 73/100  soft-tissue]
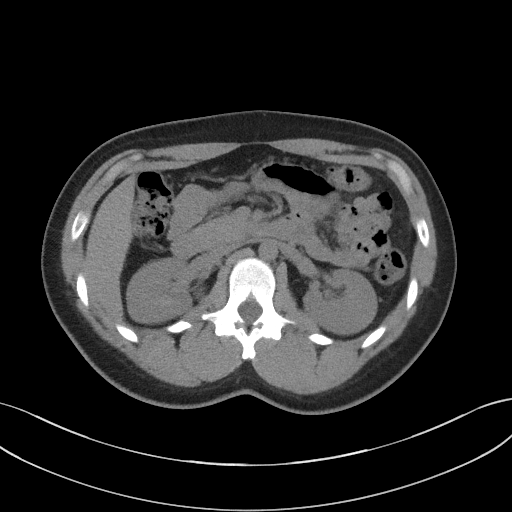
[im 80/100  soft-tissue]
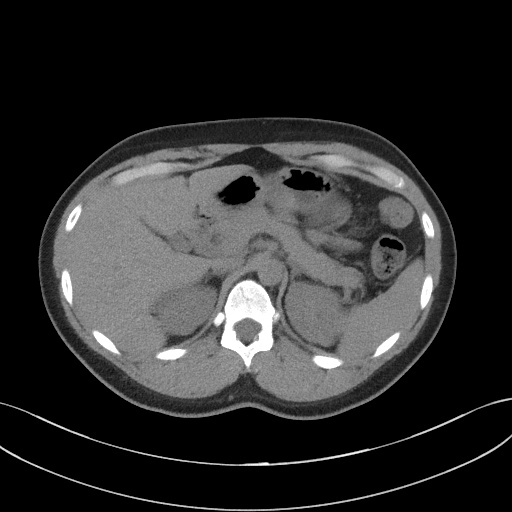
[im 88/100  soft-tissue]
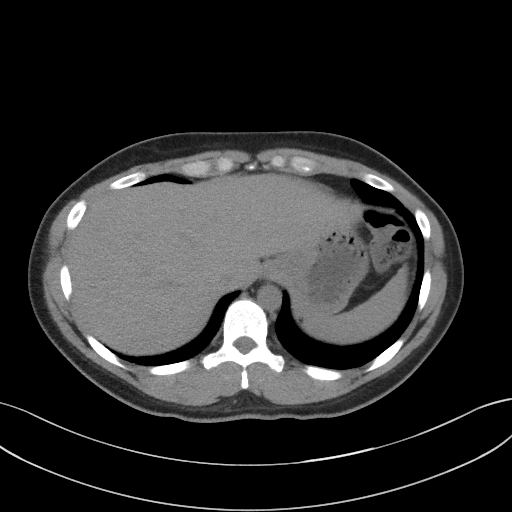
[im 96/100  soft-tissue]
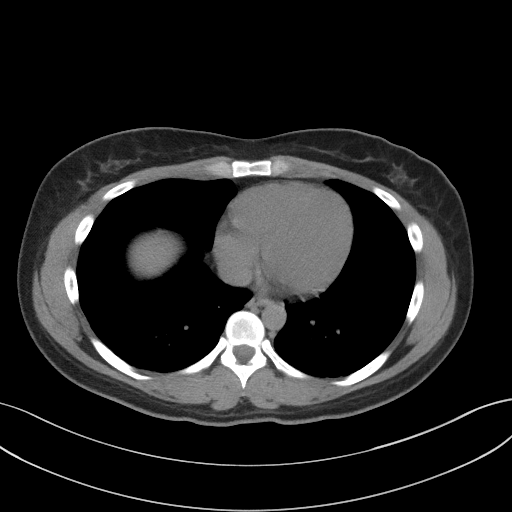

[Series 5: coronal st · coronal · 0.81mm/px · 3 of 101 slices shown]
[im 34/101  soft-tissue]
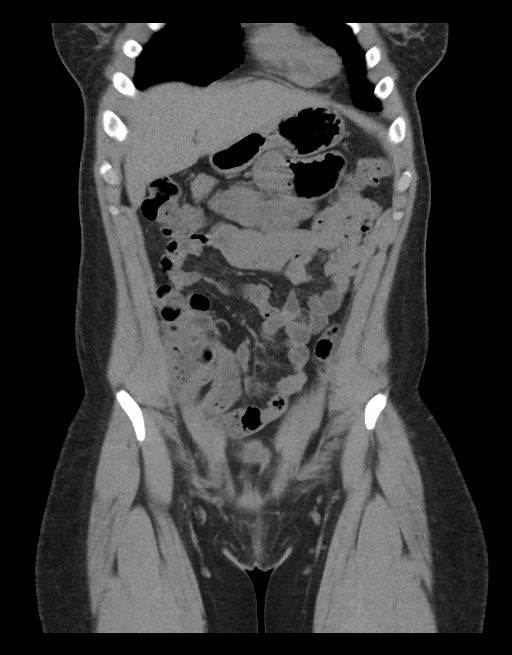
[im 45/101  soft-tissue]
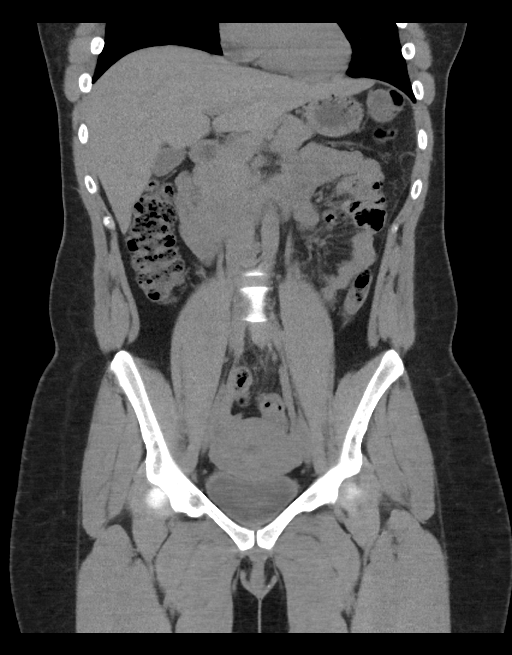
[im 56/101  soft-tissue]
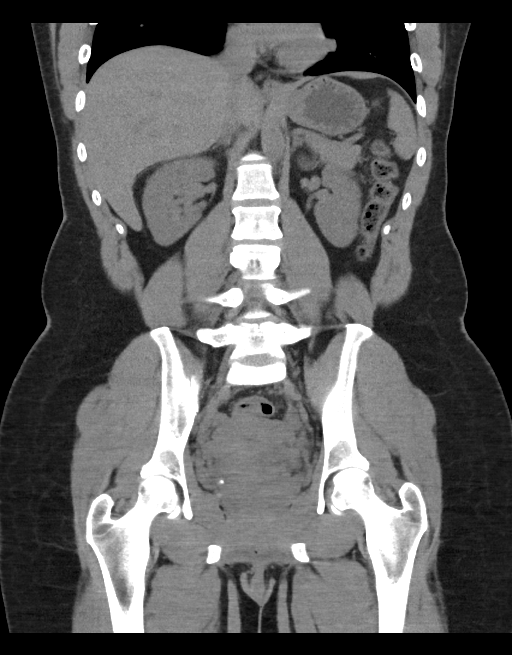

[16 of 46 positions shown; findings below may reference images not displayed]

FINDINGS: Lower chest: No acute abnormality.

Hepatobiliary: No focal liver abnormality is seen. No gallstones,
gallbladder wall thickening, or biliary dilatation.

Pancreas: Unremarkable. No pancreatic ductal dilatation or
surrounding inflammatory changes.

Spleen: Normal in size without focal abnormality.

Adrenals/Urinary Tract: Adrenal glands are unremarkable. Kidneys are
normal, without renal calculi, focal lesion, or hydronephrosis.
Bladder is unremarkable.

Stomach/Bowel: Stomach is within normal limits. Appendix appears
normal. No evidence of bowel wall thickening, distention, or
inflammatory changes.

Vascular/Lymphatic: No significant vascular findings are present. No
enlarged abdominal or pelvic lymph nodes.

Reproductive: Uterus and bilateral adnexa are unremarkable.

Other: No abdominal wall hernia or abnormality. No abdominopelvic
ascites.

Musculoskeletal: No acute or significant osseous findings.
IMPRESSION: Negative. No CT evidence for acute intra-abdominal or pelvic
abnormality.
